# Patient Record
Sex: Male | Born: 1960 | Hispanic: No | Smoking: Current every day smoker
Health system: Southern US, Community
[De-identification: ages and names within clinical notes are randomized; demographics above are authoritative.]

## PROBLEM LIST (undated history)

## (undated) DIAGNOSIS — F329 Major depressive disorder, single episode, unspecified: Secondary | ICD-10-CM

## (undated) DIAGNOSIS — K7030 Alcoholic cirrhosis of liver without ascites: Secondary | ICD-10-CM

## (undated) DIAGNOSIS — R0902 Hypoxemia: Secondary | ICD-10-CM

## (undated) DIAGNOSIS — I10 Essential (primary) hypertension: Secondary | ICD-10-CM

## (undated) DIAGNOSIS — E876 Hypokalemia: Secondary | ICD-10-CM

## (undated) DIAGNOSIS — F1010 Alcohol abuse, uncomplicated: Secondary | ICD-10-CM

## (undated) DIAGNOSIS — B1910 Unspecified viral hepatitis B without hepatic coma: Secondary | ICD-10-CM

## (undated) DIAGNOSIS — D649 Anemia, unspecified: Secondary | ICD-10-CM

## (undated) DIAGNOSIS — J45909 Unspecified asthma, uncomplicated: Secondary | ICD-10-CM

## (undated) DIAGNOSIS — R0600 Dyspnea, unspecified: Secondary | ICD-10-CM

## (undated) DIAGNOSIS — R296 Repeated falls: Secondary | ICD-10-CM

## (undated) DIAGNOSIS — F32A Depression, unspecified: Secondary | ICD-10-CM

## (undated) DIAGNOSIS — R531 Weakness: Secondary | ICD-10-CM

## (undated) DIAGNOSIS — J189 Pneumonia, unspecified organism: Secondary | ICD-10-CM

## (undated) DIAGNOSIS — G2581 Restless legs syndrome: Secondary | ICD-10-CM

## (undated) DIAGNOSIS — R7989 Other specified abnormal findings of blood chemistry: Secondary | ICD-10-CM

## (undated) DIAGNOSIS — R945 Abnormal results of liver function studies: Secondary | ICD-10-CM

## (undated) HISTORY — PX: FRACTURE SURGERY: SHX138

## (undated) HISTORY — PX: HERNIA REPAIR: SHX51

---

## 2017-01-24 ENCOUNTER — Other Ambulatory Visit (HOSPITAL_COMMUNITY): Payer: Self-pay | Admitting: Nurse Practitioner

## 2017-01-24 DIAGNOSIS — B181 Chronic viral hepatitis B without delta-agent: Secondary | ICD-10-CM

## 2017-02-19 ENCOUNTER — Other Ambulatory Visit (HOSPITAL_COMMUNITY): Payer: Self-pay | Admitting: Nurse Practitioner

## 2017-02-19 DIAGNOSIS — B181 Chronic viral hepatitis B without delta-agent: Secondary | ICD-10-CM

## 2017-02-21 ENCOUNTER — Other Ambulatory Visit: Payer: Self-pay | Admitting: Nurse Practitioner

## 2017-02-21 DIAGNOSIS — K7469 Other cirrhosis of liver: Secondary | ICD-10-CM

## 2017-03-02 ENCOUNTER — Ambulatory Visit (HOSPITAL_COMMUNITY)
Admission: RE | Admit: 2017-03-02 | Discharge: 2017-03-02 | Disposition: A | Discharge status: Home / Self Care | Payer: Medicaid Other | Source: Ambulatory Visit | Attending: Nurse Practitioner | Admitting: Nurse Practitioner

## 2017-03-02 ENCOUNTER — Other Ambulatory Visit: Payer: Self-pay | Admitting: General Surgery

## 2017-03-02 DIAGNOSIS — B181 Chronic viral hepatitis B without delta-agent: Secondary | ICD-10-CM | POA: Insufficient documentation

## 2017-03-02 DIAGNOSIS — D739 Disease of spleen, unspecified: Secondary | ICD-10-CM | POA: Insufficient documentation

## 2017-03-02 DIAGNOSIS — R188 Other ascites: Secondary | ICD-10-CM | POA: Insufficient documentation

## 2017-03-02 DIAGNOSIS — K7469 Other cirrhosis of liver: Secondary | ICD-10-CM

## 2017-03-02 DIAGNOSIS — R932 Abnormal findings on diagnostic imaging of liver and biliary tract: Secondary | ICD-10-CM | POA: Diagnosis not present

## 2017-03-05 ENCOUNTER — Ambulatory Visit (HOSPITAL_COMMUNITY)
Admission: RE | Admit: 2017-03-05 | Discharge: 2017-03-05 | Disposition: A | Discharge status: Home / Self Care | Payer: Medicaid Other | Source: Ambulatory Visit | Attending: Nurse Practitioner | Admitting: Nurse Practitioner

## 2017-03-05 ENCOUNTER — Encounter (HOSPITAL_COMMUNITY): Payer: Self-pay

## 2017-03-05 ENCOUNTER — Ambulatory Visit (HOSPITAL_COMMUNITY): Admission: RE | Admit: 2017-03-05 | Payer: Medicaid Other | Source: Ambulatory Visit

## 2017-03-05 DIAGNOSIS — K7460 Unspecified cirrhosis of liver: Secondary | ICD-10-CM | POA: Insufficient documentation

## 2017-03-05 DIAGNOSIS — B181 Chronic viral hepatitis B without delta-agent: Secondary | ICD-10-CM

## 2017-03-05 DIAGNOSIS — I10 Essential (primary) hypertension: Secondary | ICD-10-CM | POA: Insufficient documentation

## 2017-03-05 DIAGNOSIS — R188 Other ascites: Secondary | ICD-10-CM | POA: Diagnosis not present

## 2017-03-05 DIAGNOSIS — K831 Obstruction of bile duct: Secondary | ICD-10-CM | POA: Insufficient documentation

## 2017-03-05 DIAGNOSIS — F419 Anxiety disorder, unspecified: Secondary | ICD-10-CM | POA: Diagnosis not present

## 2017-03-05 DIAGNOSIS — G2581 Restless legs syndrome: Secondary | ICD-10-CM | POA: Insufficient documentation

## 2017-03-05 DIAGNOSIS — F329 Major depressive disorder, single episode, unspecified: Secondary | ICD-10-CM | POA: Insufficient documentation

## 2017-03-05 DIAGNOSIS — Z720 Tobacco use: Secondary | ICD-10-CM | POA: Insufficient documentation

## 2017-03-05 HISTORY — DX: Repeated falls: R29.6

## 2017-03-05 HISTORY — DX: Hypoxemia: R09.02

## 2017-03-05 HISTORY — DX: Unspecified asthma, uncomplicated: J45.909

## 2017-03-05 HISTORY — DX: Hypokalemia: E87.6

## 2017-03-05 HISTORY — DX: Depression, unspecified: F32.A

## 2017-03-05 HISTORY — DX: Essential (primary) hypertension: I10

## 2017-03-05 HISTORY — DX: Weakness: R53.1

## 2017-03-05 HISTORY — DX: Unspecified viral hepatitis B without hepatic coma: B19.10

## 2017-03-05 HISTORY — DX: Pneumonia, unspecified organism: J18.9

## 2017-03-05 HISTORY — DX: Restless legs syndrome: G25.81

## 2017-03-05 HISTORY — DX: Alcoholic cirrhosis of liver without ascites: K70.30

## 2017-03-05 HISTORY — DX: Major depressive disorder, single episode, unspecified: F32.9

## 2017-03-05 HISTORY — DX: Other specified abnormal findings of blood chemistry: R79.89

## 2017-03-05 HISTORY — DX: Abnormal results of liver function studies: R94.5

## 2017-03-05 HISTORY — DX: Alcohol abuse, uncomplicated: F10.10

## 2017-03-05 HISTORY — DX: Dyspnea, unspecified: R06.00

## 2017-03-05 HISTORY — DX: Anemia, unspecified: D64.9

## 2017-03-05 LAB — COMPREHENSIVE METABOLIC PANEL
ALT: 159 U/L — ABNORMAL HIGH (ref 17–63)
ANION GAP: 10 (ref 5–15)
AST: 297 U/L — ABNORMAL HIGH (ref 15–41)
Albumin: 2 g/dL — ABNORMAL LOW (ref 3.5–5.0)
Alkaline Phosphatase: 178 U/L — ABNORMAL HIGH (ref 38–126)
BUN: 13 mg/dL (ref 6–20)
CHLORIDE: 91 mmol/L — AB (ref 101–111)
CO2: 31 mmol/L (ref 22–32)
Calcium: 8.2 mg/dL — ABNORMAL LOW (ref 8.9–10.3)
Creatinine, Ser: 0.61 mg/dL (ref 0.61–1.24)
Glucose, Bld: 124 mg/dL — ABNORMAL HIGH (ref 65–99)
POTASSIUM: 2.2 mmol/L — AB (ref 3.5–5.1)
SODIUM: 132 mmol/L — AB (ref 135–145)
Total Bilirubin: 20.6 mg/dL (ref 0.3–1.2)
Total Protein: 7.8 g/dL (ref 6.5–8.1)

## 2017-03-05 LAB — GRAM STAIN

## 2017-03-05 LAB — CBC
HEMATOCRIT: 28.8 % — AB (ref 39.0–52.0)
Hemoglobin: 10.4 g/dL — ABNORMAL LOW (ref 13.0–17.0)
MCH: 35.1 pg — ABNORMAL HIGH (ref 26.0–34.0)
MCHC: 36.1 g/dL — AB (ref 30.0–36.0)
MCV: 97.3 fL (ref 78.0–100.0)
PLATELETS: 197 10*3/uL (ref 150–400)
RBC: 2.96 MIL/uL — ABNORMAL LOW (ref 4.22–5.81)
RDW: 17.6 % — AB (ref 11.5–15.5)
WBC: 6.9 10*3/uL (ref 4.0–10.5)

## 2017-03-05 LAB — BODY FLUID CELL COUNT WITH DIFFERENTIAL
LYMPHS FL: 18 %
Monocyte-Macrophage-Serous Fluid: 70 % (ref 50–90)
NEUTROPHIL FLUID: 12 % (ref 0–25)
WBC FLUID: 216 uL (ref 0–1000)

## 2017-03-05 LAB — PROTIME-INR
INR: 1.8
PROTHROMBIN TIME: 21.2 s — AB (ref 11.4–15.2)

## 2017-03-05 LAB — APTT: aPTT: 38 seconds — ABNORMAL HIGH (ref 24–36)

## 2017-03-05 MED ORDER — SODIUM CHLORIDE 0.9 % IV SOLN
INTRAVENOUS | Status: DC
  Administered 2017-03-05: 11:00:00 via INTRAVENOUS

## 2017-03-05 MED ORDER — FENTANYL CITRATE (PF) 100 MCG/2ML IJ SOLN
INTRAMUSCULAR | Status: AC | PRN
  Administered 2017-03-05 (×2): 50 ug via INTRAVENOUS

## 2017-03-05 MED ORDER — POTASSIUM CHLORIDE CRYS ER 20 MEQ PO TBCR
40.0000 meq | EXTENDED_RELEASE_TABLET | Freq: Two times a day (BID) | ORAL | Status: DC
  Administered 2017-03-05: 40 meq via ORAL
  Filled 2017-03-05: qty 2

## 2017-03-05 MED ORDER — FENTANYL CITRATE (PF) 100 MCG/2ML IJ SOLN
INTRAMUSCULAR | Status: AC
  Filled 2017-03-05: qty 2

## 2017-03-05 MED ORDER — MIDAZOLAM HCL 2 MG/2ML IJ SOLN
INTRAMUSCULAR | Status: AC | PRN
  Administered 2017-03-05 (×2): 0.5 mg via INTRAVENOUS
  Administered 2017-03-05: 1 mg via INTRAVENOUS

## 2017-03-05 MED ORDER — MIDAZOLAM HCL 2 MG/2ML IJ SOLN
INTRAMUSCULAR | Status: AC
  Filled 2017-03-05: qty 4

## 2017-03-05 NOTE — Sedation Documentation (Signed)
Patient is resting comfortably. 

## 2017-03-05 NOTE — Procedures (Signed)
1.  US paracentesis 2.  US liver core 18g x3 to surg path No complication No blood loss. [facility].

## 2017-03-05 NOTE — Sedation Documentation (Signed)
Biopsy has begun. Pt comfortable. NAD at this time, vitals stable.

## 2017-03-05 NOTE — Progress Notes (Signed)
Patient ID: Frederick Chapman, male   DOB: 09/08/1961, 56 y.o.   MRN: [id number] Patient given 40 mEq by mouth K Dur for 2.2 potassium level today. Patient advised to follow-up with hepatologist for biopsy results and follow-up lab studies.

## 2017-03-05 NOTE — Discharge Instructions (Addendum)
Paracentesis, Care After Refer to this sheet in the next few weeks. These instructions provide you with information about caring for yourself after your procedure. Your health care provider may also give you more specific instructions. Your treatment has been planned according to current medical practices, but problems sometimes occur. Call your health care provider if you have any problems or questions after your procedure. What can I expect after the procedure? After your procedure, it is common to have a small amount of clear fluid coming from the puncture site. Follow these instructions at home:  Return to your normal activities as told by your health care provider. Ask your health care provider what activities are safe for you.  Take over-the-counter and prescription medicines only as told by your health care provider.  Do not take baths, swim, or use a hot tub until your health care provider approves.  Follow instructions from your health care provider about:  How to take care of your puncture site.  When and how you should change your bandage (dressing).  When you should remove your dressing.  Check your puncture area every day signs of infection. Watch for:  Redness, swelling, or pain.  Fluid, blood, or pus.  Keep all follow-up visits as told by your health care provider. This is important. Contact a health care provider if:  You have redness, swelling, or pain at your puncture site.  You start to have more clear fluid coming from your puncture site.  You have blood or pus coming from your puncture site.  You have chills.  You have a fever. Get help right away if:  You develop chest pain or shortness of breath.  You develop increasing pain, discomfort, or swelling in your abdomen.  You feel dizzy or light-headed or you pass out. This information is not intended to replace advice given to you by your health care provider. Make sure you discuss any questions you  have with your health care provider. Document Released: 03/02/2015 Document Revised: 03/23/2016 Document Reviewed: 12/29/2014 Elsevier Interactive Patient Education  2017 [employer]   Moderate Conscious Sedation, Adult, Care After These instructions provide you with information about caring for yourself after your procedure. Your health care provider may also give you more specific instructions. Your treatment has been planned according to current medical practices, but problems sometimes occur. Call your health care provider if you have any problems or questions after your procedure. What can I expect after the procedure? After your procedure, it is common:  To feel sleepy for several hours.  To feel clumsy and have poor balance for several hours.  To have poor judgment for several hours.  To vomit if you eat too soon. Follow these instructions at home: For at least 24 hours after the procedure:    Do not:  Participate in activities where you could fall or become injured.  Drive.  Use heavy machinery.  Drink alcohol.  Take sleeping pills or medicines that cause drowsiness.  Make important decisions or sign legal documents.  Take care of children on your own.  Rest. Eating and drinking   Follow the diet recommended by your health care provider.  If you vomit:  Drink water, juice, or soup when you can drink without vomiting.  Make sure you have little or no nausea before eating solid foods. General instructions   Have a responsible adult stay with you until you are awake and alert.  Take over-the-counter and prescription medicines only as told by your health  care provider.  If you smoke, do not smoke without supervision.  Keep all follow-up visits as told by your health care provider. This is important. Contact a health care provider if:  You keep feeling nauseous or you keep vomiting.  You feel light-headed.  You develop a rash.  You have a  fever. Get help right away if:  You have trouble breathing. This information is not intended to replace advice given to you by your health care provider. Make sure you discuss any questions you have with your health care provider. Document Released: 08/06/2013 Document Revised: 03/20/2016 Document Reviewed: 02/05/2016 Elsevier Interactive Patient Education  2017 [employer] Liver Biopsy, Care After These instructions give you information on caring for yourself after your procedure. Your doctor may also give you more specific instructions. Call your doctor if you have any problems or questions after your procedure. Follow these instructions at home:  Rest at home for 1-2 days or as told by your doctor.  Have someone stay with you for at least 24 hours.  Do not do these things in the first 24 hours:  Drive.  Use machinery.  Take care of other people.  Sign legal documents.  Take a bath or shower.  There are many different ways to close and cover a cut (incision). For example, a cut can be closed with stitches, skin glue, or adhesive strips. Follow your doctor's instructions on:  Taking care of your cut.  Changing and removing your bandage (dressing).  Removing whatever was used to close your cut.  Do not drink alcohol in the first week.  Do not lift more than 5 pounds or play contact sports for the first 2 weeks.  Take medicines only as told by your doctor. For 1 week, do not take medicine that has aspirin in it or medicines like ibuprofen.  Get your test results. Contact a doctor if:  A cut bleeds and leaves more than just a small spot of blood.  A cut is red, puffs up (swells), or hurts more than before.  Fluid or something else comes from a cut.  A cut smells bad.  You have a fever or chills. Get help right away if:  You have swelling, bloating, or pain in your belly (abdomen).  You get dizzy or faint.  You have a rash.  You feel sick to your stomach  (nauseous) or throw up (vomit).  You have trouble breathing, feel short of breath, or feel faint.  Your chest hurts.  You have problems talking or seeing.  You have trouble balancing or moving your arms or legs. This information is not intended to replace advice given to you by your health care provider. Make sure you discuss any questions you have with your health care provider. Document Released: 07/25/2008 Document Revised: 03/23/2016 Document Reviewed: 12/12/2013 Elsevier Interactive Patient Education  2017 [employer]

## 2017-03-05 NOTE — Consult Note (Signed)
Chief Complaint: Patient was seen in consultation today for image guided random core liver biopsy  Referring Physician(s): Drazek,Dawn  Supervising Physician: Oley Balm  Patient Status: [facility] - Out-pt  History of Present Illness: Frederick Chapman is a 56 y.o. male with history of acute hepatitis B, cirrhosis by imaging along with ascites, elevated liver function tests and alcohol abuse who presents today for random core liver biopsy/possible paracentesis for further evaluation.  PMH: HTN, anxiety/depression, restless leg syndrome, tobacco abuse  PSH: Fracture surg, hernia repair Allergies: Patient has no allergy information on record.  Medications: Prior to Admission medications   Not on File     No family history on file.  Social History   Social History  . Marital status: Unknown    Spouse name: N/A  . Number of children: N/A  . Years of education: N/A   Social History Main Topics  . Smoking status: Not on file  . Smokeless tobacco: Not on file  . Alcohol use Not on file  . Drug use: Unknown  . Sexual activity: Not on file   Other Topics Concern  . Not on file   Social History Narrative  . No narrative on file      Review of Systems denies fever, headache, chest pain, abdominal pain, nausea, vomiting or abnormal bleeding. He has occasional dyspnea, occasional cough, back pain as well as jaundice  Vital Signs: BP 129/88   Pulse 98   Temp 98.7 F (37.1 C) (Oral)   Resp 20   SpO2 97%   Physical Exam awake, alert; jaundiced with scleral icterus; chest with slightly diminished breath sounds at bases along with few crackles rt base; heart with regular rate and rhythm. Abdomen soft, distended, positive bowel sounds, nontender. No lower extremity edema  Mallampati Score:     Imaging: US Abdomen Complete  Result Date: 03/02/2017 CLINICAL DATA:  Hepatic cirrhosis EXAM: ABDOMEN ULTRASOUND COMPLETE COMPARISON:  CT abdomen and pelvis February 03, 2017  FINDINGS: Gallbladder: Sludge is noted in the gallbladder. There are no echogenic foci which move and shadow as is expected with gallstones. Gallbladder wall appears mildly thickened. There is no appreciable pericholecystic fluid. No sonographic Murphy sign noted by sonographer. Common bile duct: Diameter: 5 mm. There is no intrahepatic, common hepatic, or common bile duct dilatation. Liver: No focal lesion identified. Liver as a nodular contour consistent with cirrhosis. Liver echogenicity is increased. IVC: No abnormality visualized in regions which can be evaluated. Much of the infrahepatic inferior vena cava is obscured by gas. Pancreas: Pancreas is essentially completely obscured by gas. Spleen: Size and appearance within normal limits except for scattered calcified granulomas in the spleen. Right Kidney: Length: 10.5 cm. Echogenicity within normal limits. No mass or hydronephrosis visualized. Left Kidney: Length: 11.7 Cm. Echogenicity within normal limits. No mass or hydronephrosis visualized. Abdominal aorta: No aneurysm visualized in areas not obscured by gas. Much of the aorta obscured by gas. Other findings: There is moderate ascites. IMPRESSION: Moderate ascites. There is sludge in gallbladder. Gallbladder wall appears mildly thickened. Gallbladder wall thickening may be seen with ascites. A degree of acalculus cholecystitis cannot be excluded, however. In this regard, it may be prudent to consider nuclear medicine hepatobiliary imaging study to assess for cystic duct patency. The contour and echogenicity of the liver are consistent with known cirrhosis. No focal liver lesions are evident. It should be cautioned that the sensitivity of ultrasound for detection of focal liver lesions is diminished in this circumstance. Pancreas obscured  by gas. Much of the inferior vena cava and aorta obscured by gas. Scattered calcified granulomas in the spleen. Electronically Signed   By: Bretta BangWilliam  Woodruff III M.D.   On:  03/02/2017 14:05    Labs:  CBC: No results for input(s): WBC, HGB, HCT, PLT in the last 8760 hours.  COAGS: No results for input(s): INR, APTT in the last 8760 hours.  BMP: No results for input(s): NA, K, CL, CO2, GLUCOSE, BUN, CALCIUM, CREATININE, GFRNONAA, GFRAA in the last 8760 hours.  Invalid input(s): CMP  LIVER FUNCTION TESTS: No results for input(s): BILITOT, AST, ALT, ALKPHOS, PROT, ALBUMIN in the last 8760 hours.  TUMOR MARKERS: No results for input(s): AFPTM, CEA, CA199, CHROMGRNA in the last 8760 hours.  Assessment and Plan: 56 y.o. male with history of acute hepatitis B, cirrhosis by imaging along with ascites, elevated liver function tests and alcohol abuse who presents today for random core liver biopsy/possible paracentesis for further evaluation.Risks and benefits discussed with the patient including, but not limited to bleeding, infection, damage to adjacent structures or low yield requiring additional tests.All of the patient's questions were answered, patient is agreeable to proceed.Consent signed and in chart. Labs pending.      Thank you for this interesting consult.  I greatly enjoyed meeting Frederick Chapman and look forward to participating in their care.  A copy of this report was sent to the requesting provider on this date.  Electronically Signed: D. Jeananne RamaKevin Alylah Blakney 03/05/2017, 11:10 AM   I spent a total of 20 minutes    in face to face in clinical consultation, greater than 50% of which was counseling/coordinating care for image guided random core liver biopsy/possible paracentesis

## 2017-03-05 NOTE — Sedation Documentation (Signed)
Pt will receive Paracentesis prior to biopsy.

## 2017-03-06 LAB — PATHOLOGIST SMEAR REVIEW

## 2017-03-10 LAB — CULTURE, BODY FLUID W GRAM STAIN -BOTTLE

## 2017-03-10 LAB — CULTURE, BODY FLUID-BOTTLE: CULTURE: NO GROWTH

## 2017-03-30 DEATH — deceased

## 2018-09-05 IMAGING — US US PARACENTESIS
1 series · 12 of 12 positions shown · non-contrast
Comparison: none

CLINICAL DATA: Hepatitis-B, cirrhosis. Percutaneous core liver
biopsy has been requested. Pre-procedure paracentesis to minimize
bleeding risk.

EXAM:
ULTRASOUND GUIDED PARACENTESIS
TECHNIQUE: The procedure, risks (including but not limited to bleeding,
infection, organ damage ), benefits, and alternatives were explained
to the patient. Questions regarding the procedure were encouraged
and answered. The patient understands and consents to the procedure.
Survey ultrasound of the abdomen was performed and an appropriate
skin entry site in the abdomen was selected. Skin site was marked,
prepped with chlorhexadine, draped in usual sterile fashion, and
infiltrated locally with 1% lidocaine. A Safe-T-Centesis needle was
advanced into the peritoneal space until fluid could be aspirated.
The sheath was advanced and the needle removed. 3.3 L of yellow
ascites were aspirated. A sample was sent for the requested
laboratory studies.
The patient tolerated the procedure well.
COMPLICATIONS:
COMPLICATIONS
none

[Series 1: us paracentesis · 0.25mm/px · 12 of 12 slices shown]
[im 1/12]
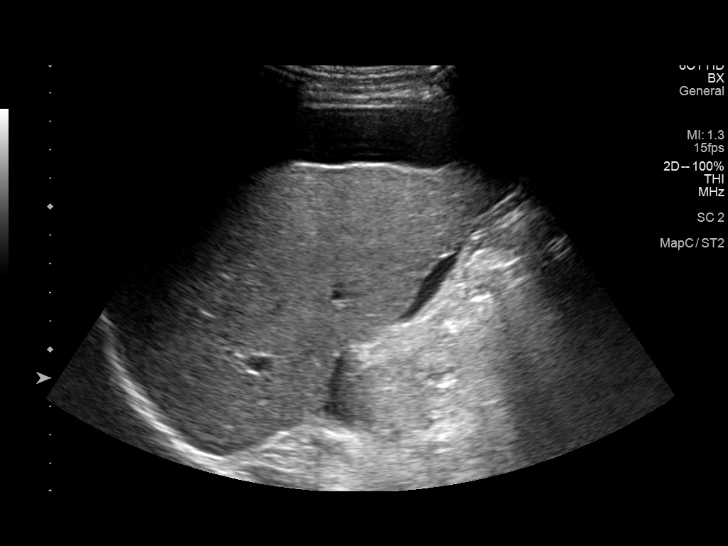
[im 2/12]
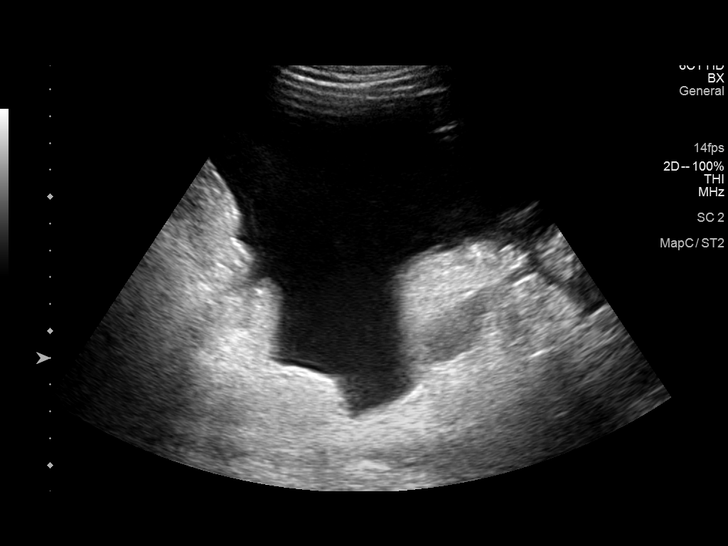
[im 3/12]
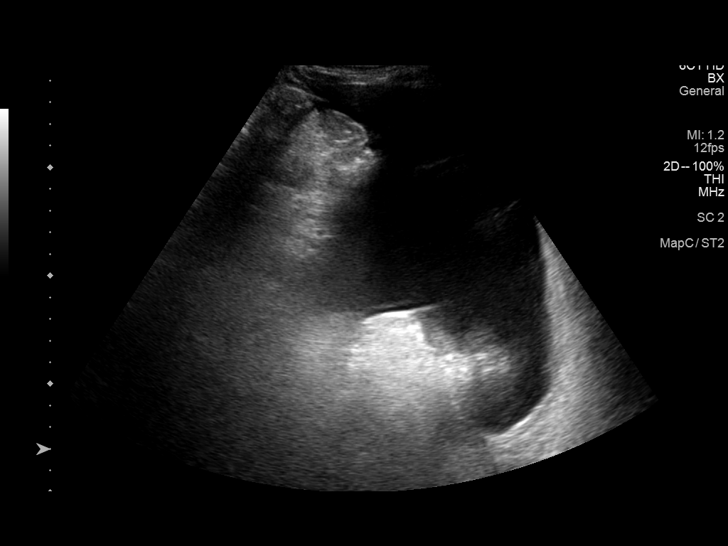
[im 4/12]
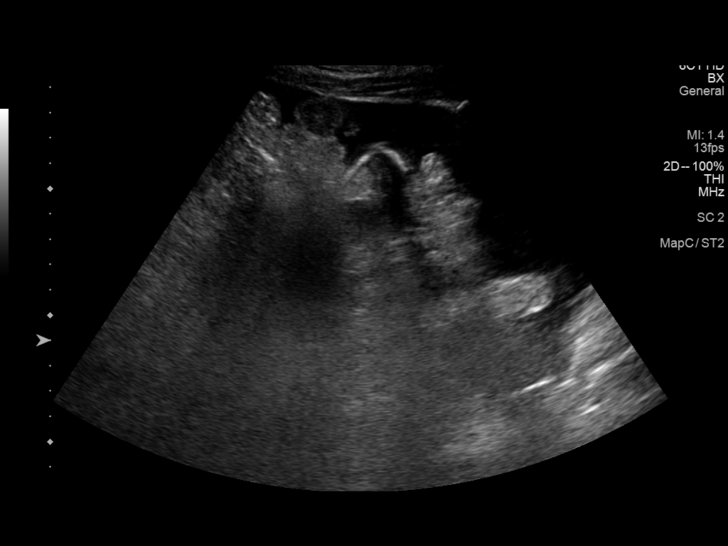
[im 5/12]
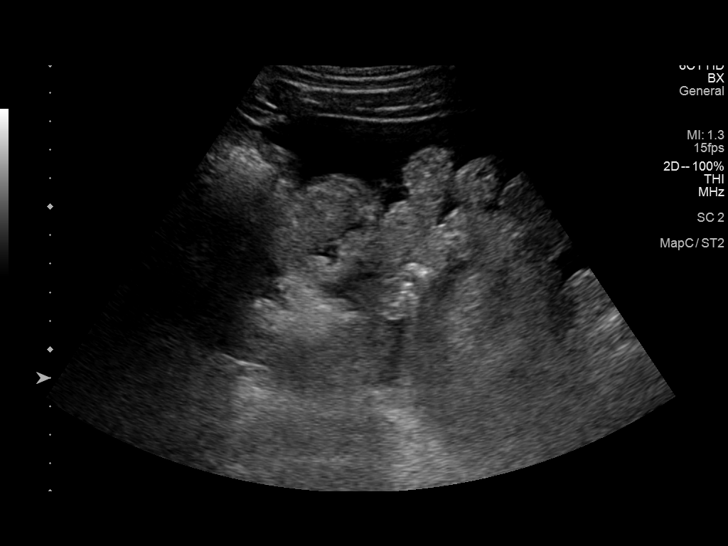
[im 6/12]
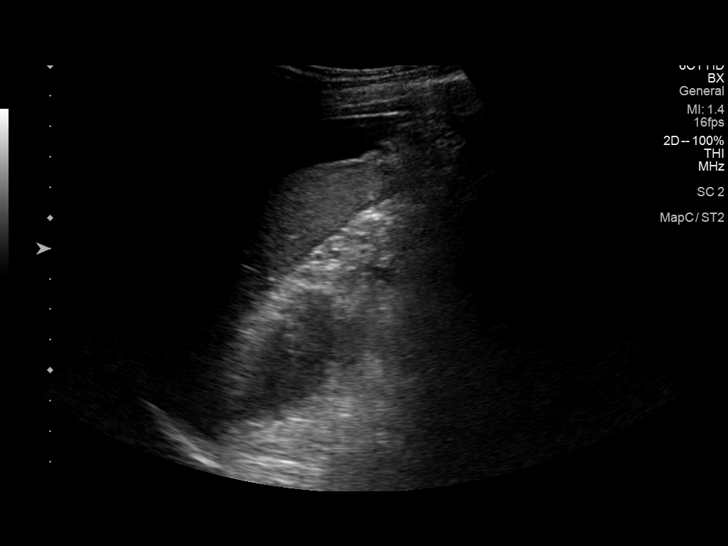
[im 7/12]
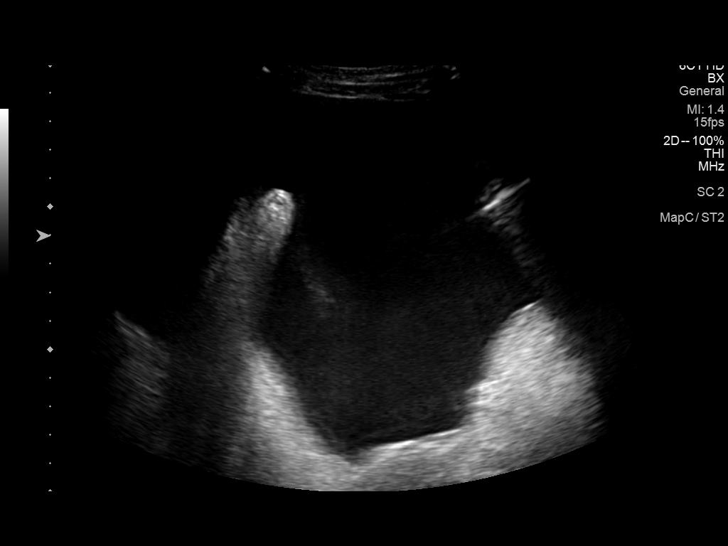
[im 8/12]
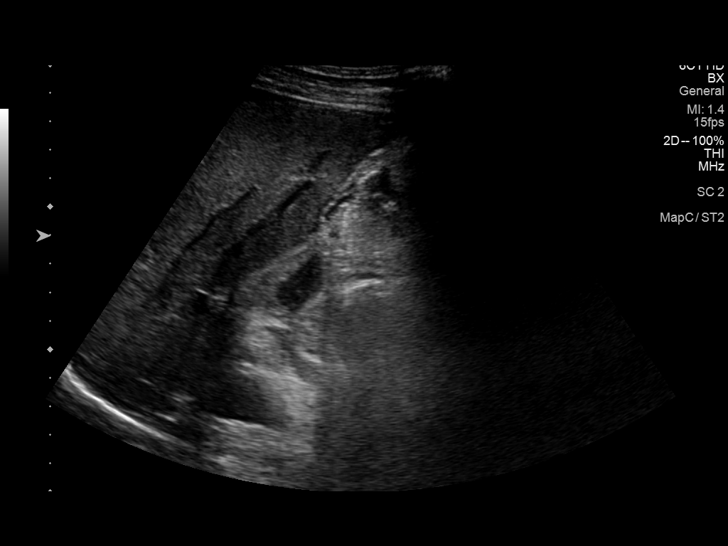
[im 9/12]
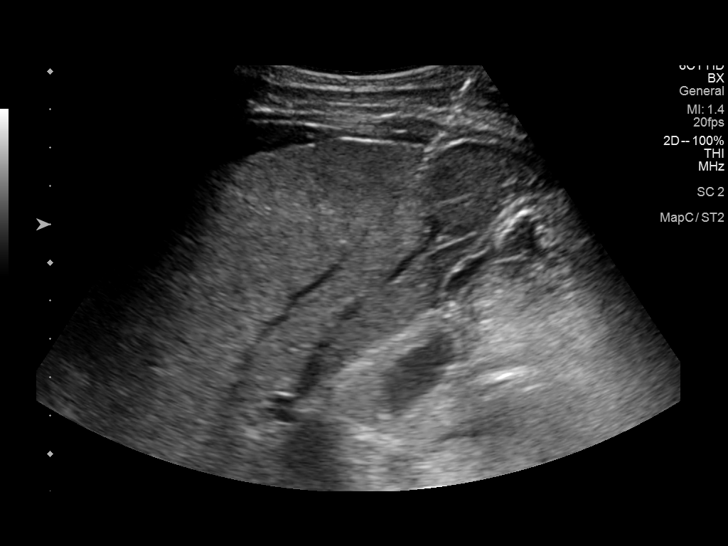
[im 10/12]
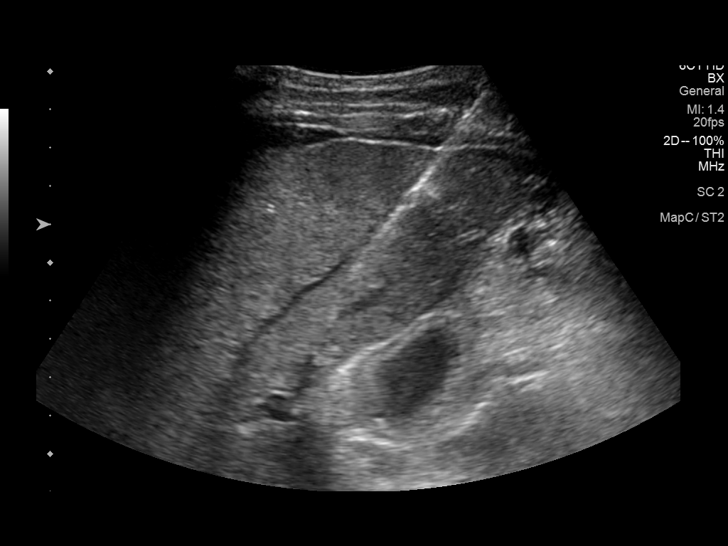
[im 11/12]
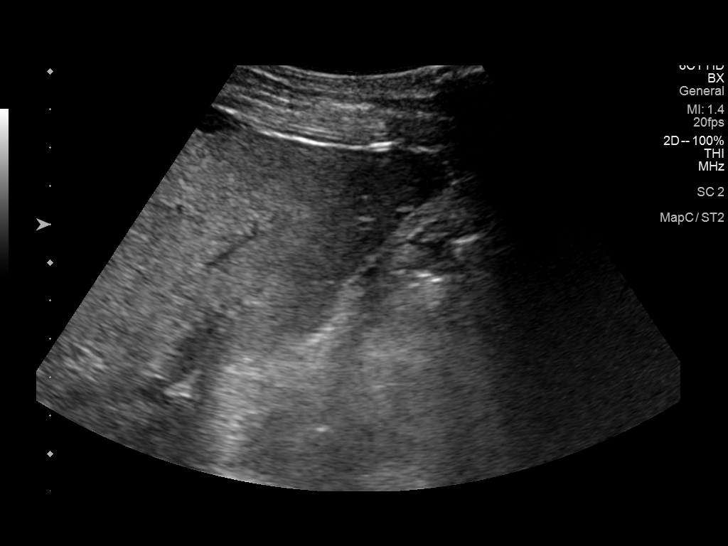
[im 12/12]
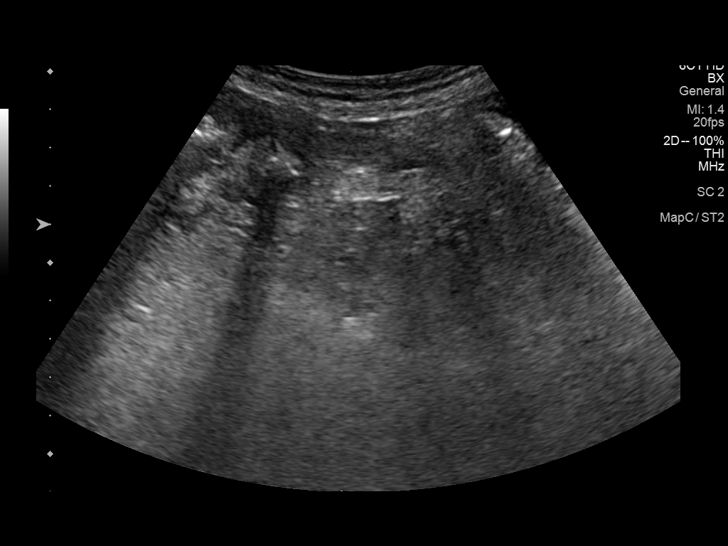

[12 of 12 positions shown; findings below may reference images not displayed]

IMPRESSION: Technically successful ultrasound guided paracentesis, removing
L ascites.
